# Patient Record
Sex: Female | Born: 2000 | Race: White | Hispanic: No | Marital: Single | Smoking: Never smoker
Health system: Southern US, Community
[De-identification: ages and names within clinical notes are randomized; demographics above are authoritative.]

## PROBLEM LIST (undated history)

## (undated) DIAGNOSIS — E049 Nontoxic goiter, unspecified: Secondary | ICD-10-CM

## (undated) DIAGNOSIS — R7303 Prediabetes: Secondary | ICD-10-CM

## (undated) DIAGNOSIS — R1013 Epigastric pain: Secondary | ICD-10-CM

## (undated) DIAGNOSIS — E301 Precocious puberty: Secondary | ICD-10-CM

## (undated) HISTORY — PX: TONSILLECTOMY: SUR1361

## (undated) HISTORY — PX: ADENOIDECTOMY: SUR15

## (undated) HISTORY — DX: Precocious puberty: E30.1

## (undated) HISTORY — DX: Prediabetes: R73.03

## (undated) HISTORY — DX: Nontoxic goiter, unspecified: E04.9

## (undated) HISTORY — DX: Epigastric pain: R10.13

---
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
## 2019-10-16 DIAGNOSIS — Z419 Encounter for procedure for purposes other than remedying health state, unspecified: Secondary | ICD-10-CM | POA: Diagnosis not present

## 2019-10-28 DIAGNOSIS — Z131 Encounter for screening for diabetes mellitus: Secondary | ICD-10-CM | POA: Diagnosis not present

## 2019-10-28 DIAGNOSIS — N771 Vaginitis, vulvitis and vulvovaginitis in diseases classified elsewhere: Secondary | ICD-10-CM | POA: Diagnosis not present

## 2019-10-28 DIAGNOSIS — Z1321 Encounter for screening for nutritional disorder: Secondary | ICD-10-CM | POA: Diagnosis not present

## 2019-11-04 DIAGNOSIS — N760 Acute vaginitis: Secondary | ICD-10-CM | POA: Diagnosis not present

## 2019-11-04 DIAGNOSIS — Z113 Encounter for screening for infections with a predominantly sexual mode of transmission: Secondary | ICD-10-CM | POA: Diagnosis not present

## 2019-11-04 DIAGNOSIS — Z3040 Encounter for surveillance of contraceptives, unspecified: Secondary | ICD-10-CM | POA: Diagnosis not present

## 2019-11-04 DIAGNOSIS — Z01419 Encounter for gynecological examination (general) (routine) without abnormal findings: Secondary | ICD-10-CM | POA: Diagnosis not present

## 2019-11-16 DIAGNOSIS — Z419 Encounter for procedure for purposes other than remedying health state, unspecified: Secondary | ICD-10-CM | POA: Diagnosis not present

## 2019-12-17 DIAGNOSIS — Z419 Encounter for procedure for purposes other than remedying health state, unspecified: Secondary | ICD-10-CM | POA: Diagnosis not present

## 2020-01-16 DIAGNOSIS — Z419 Encounter for procedure for purposes other than remedying health state, unspecified: Secondary | ICD-10-CM | POA: Diagnosis not present

## 2020-02-16 DIAGNOSIS — Z419 Encounter for procedure for purposes other than remedying health state, unspecified: Secondary | ICD-10-CM | POA: Diagnosis not present

## 2020-03-17 DIAGNOSIS — Z419 Encounter for procedure for purposes other than remedying health state, unspecified: Secondary | ICD-10-CM | POA: Diagnosis not present

## 2020-04-17 DIAGNOSIS — Z419 Encounter for procedure for purposes other than remedying health state, unspecified: Secondary | ICD-10-CM | POA: Diagnosis not present

## 2020-05-18 DIAGNOSIS — Z419 Encounter for procedure for purposes other than remedying health state, unspecified: Secondary | ICD-10-CM | POA: Diagnosis not present

## 2020-06-15 DIAGNOSIS — Z419 Encounter for procedure for purposes other than remedying health state, unspecified: Secondary | ICD-10-CM | POA: Diagnosis not present

## 2020-07-16 DIAGNOSIS — Z419 Encounter for procedure for purposes other than remedying health state, unspecified: Secondary | ICD-10-CM | POA: Diagnosis not present

## 2020-08-13 ENCOUNTER — Other Ambulatory Visit: Payer: Self-pay | Admitting: Family

## 2020-08-13 ENCOUNTER — Ambulatory Visit
Admission: RE | Admit: 2020-08-13 | Discharge: 2020-08-13 | Disposition: A | Discharge status: Home / Self Care | Payer: Medicaid Other | Attending: Family | Admitting: Family

## 2020-08-13 ENCOUNTER — Ambulatory Visit
Admission: RE | Admit: 2020-08-13 | Discharge: 2020-08-13 | Disposition: A | Discharge status: Home / Self Care | Payer: Medicaid Other | Source: Ambulatory Visit | Attending: Family | Admitting: Family

## 2020-08-13 DIAGNOSIS — R519 Headache, unspecified: Secondary | ICD-10-CM | POA: Insufficient documentation

## 2020-08-13 DIAGNOSIS — M26602 Left temporomandibular joint disorder, unspecified: Secondary | ICD-10-CM | POA: Insufficient documentation

## 2020-08-13 DIAGNOSIS — K121 Other forms of stomatitis: Secondary | ICD-10-CM | POA: Diagnosis not present

## 2020-08-13 DIAGNOSIS — R6884 Jaw pain: Secondary | ICD-10-CM | POA: Diagnosis not present

## 2020-08-15 DIAGNOSIS — Z419 Encounter for procedure for purposes other than remedying health state, unspecified: Secondary | ICD-10-CM | POA: Diagnosis not present

## 2020-09-10 DIAGNOSIS — R109 Unspecified abdominal pain: Secondary | ICD-10-CM | POA: Diagnosis not present

## 2020-09-10 DIAGNOSIS — J986 Disorders of diaphragm: Secondary | ICD-10-CM | POA: Diagnosis not present

## 2020-09-10 DIAGNOSIS — Z881 Allergy status to other antibiotic agents status: Secondary | ICD-10-CM | POA: Diagnosis not present

## 2020-09-10 DIAGNOSIS — K760 Fatty (change of) liver, not elsewhere classified: Secondary | ICD-10-CM | POA: Diagnosis not present

## 2020-09-10 DIAGNOSIS — N3001 Acute cystitis with hematuria: Secondary | ICD-10-CM | POA: Diagnosis not present

## 2020-09-10 DIAGNOSIS — N852 Hypertrophy of uterus: Secondary | ICD-10-CM | POA: Diagnosis not present

## 2020-09-10 DIAGNOSIS — R1032 Left lower quadrant pain: Secondary | ICD-10-CM | POA: Diagnosis not present

## 2020-09-10 DIAGNOSIS — M419 Scoliosis, unspecified: Secondary | ICD-10-CM | POA: Diagnosis not present

## 2020-09-15 DIAGNOSIS — Z419 Encounter for procedure for purposes other than remedying health state, unspecified: Secondary | ICD-10-CM | POA: Diagnosis not present

## 2020-10-15 DIAGNOSIS — Z419 Encounter for procedure for purposes other than remedying health state, unspecified: Secondary | ICD-10-CM | POA: Diagnosis not present

## 2020-10-20 DIAGNOSIS — Z113 Encounter for screening for infections with a predominantly sexual mode of transmission: Secondary | ICD-10-CM | POA: Diagnosis not present

## 2020-11-15 DIAGNOSIS — Z419 Encounter for procedure for purposes other than remedying health state, unspecified: Secondary | ICD-10-CM | POA: Diagnosis not present

## 2020-12-02 DIAGNOSIS — A609 Anogenital herpesviral infection, unspecified: Secondary | ICD-10-CM | POA: Diagnosis not present

## 2020-12-02 DIAGNOSIS — Z01419 Encounter for gynecological examination (general) (routine) without abnormal findings: Secondary | ICD-10-CM | POA: Diagnosis not present

## 2020-12-02 DIAGNOSIS — Z3040 Encounter for surveillance of contraceptives, unspecified: Secondary | ICD-10-CM | POA: Diagnosis not present

## 2020-12-16 DIAGNOSIS — Z419 Encounter for procedure for purposes other than remedying health state, unspecified: Secondary | ICD-10-CM | POA: Diagnosis not present

## 2021-01-15 DIAGNOSIS — Z419 Encounter for procedure for purposes other than remedying health state, unspecified: Secondary | ICD-10-CM | POA: Diagnosis not present

## 2021-02-15 DIAGNOSIS — Z419 Encounter for procedure for purposes other than remedying health state, unspecified: Secondary | ICD-10-CM | POA: Diagnosis not present

## 2021-03-17 DIAGNOSIS — Z419 Encounter for procedure for purposes other than remedying health state, unspecified: Secondary | ICD-10-CM | POA: Diagnosis not present

## 2021-04-17 DIAGNOSIS — Z419 Encounter for procedure for purposes other than remedying health state, unspecified: Secondary | ICD-10-CM | POA: Diagnosis not present

## 2021-05-18 DIAGNOSIS — Z419 Encounter for procedure for purposes other than remedying health state, unspecified: Secondary | ICD-10-CM | POA: Diagnosis not present

## 2021-06-15 DIAGNOSIS — Z419 Encounter for procedure for purposes other than remedying health state, unspecified: Secondary | ICD-10-CM | POA: Diagnosis not present

## 2021-07-16 DIAGNOSIS — Z419 Encounter for procedure for purposes other than remedying health state, unspecified: Secondary | ICD-10-CM | POA: Diagnosis not present

## 2021-08-15 DIAGNOSIS — Z419 Encounter for procedure for purposes other than remedying health state, unspecified: Secondary | ICD-10-CM | POA: Diagnosis not present

## 2021-09-15 DIAGNOSIS — Z419 Encounter for procedure for purposes other than remedying health state, unspecified: Secondary | ICD-10-CM | POA: Diagnosis not present

## 2021-10-15 DIAGNOSIS — Z419 Encounter for procedure for purposes other than remedying health state, unspecified: Secondary | ICD-10-CM | POA: Diagnosis not present

## 2021-11-12 IMAGING — CR DG TMJ OPEN & CLOSE UNILATERAL*L*
1 series · 3 of 3 positions shown · non-contrast
Comparison: None.

CLINICAL DATA: Pt states left sided jaw pain, headaches on left
side and behind her eye for years; pt states she clenches her teeth
when she sleeps

EXAM:
LEFT UNILATERAL TEMPOROMANDIBULAR JOINTS

[Series 1: dg tmj open & close unilateral left · 0.14mm/px · 3 of 3 slices shown]
[im 1/3]
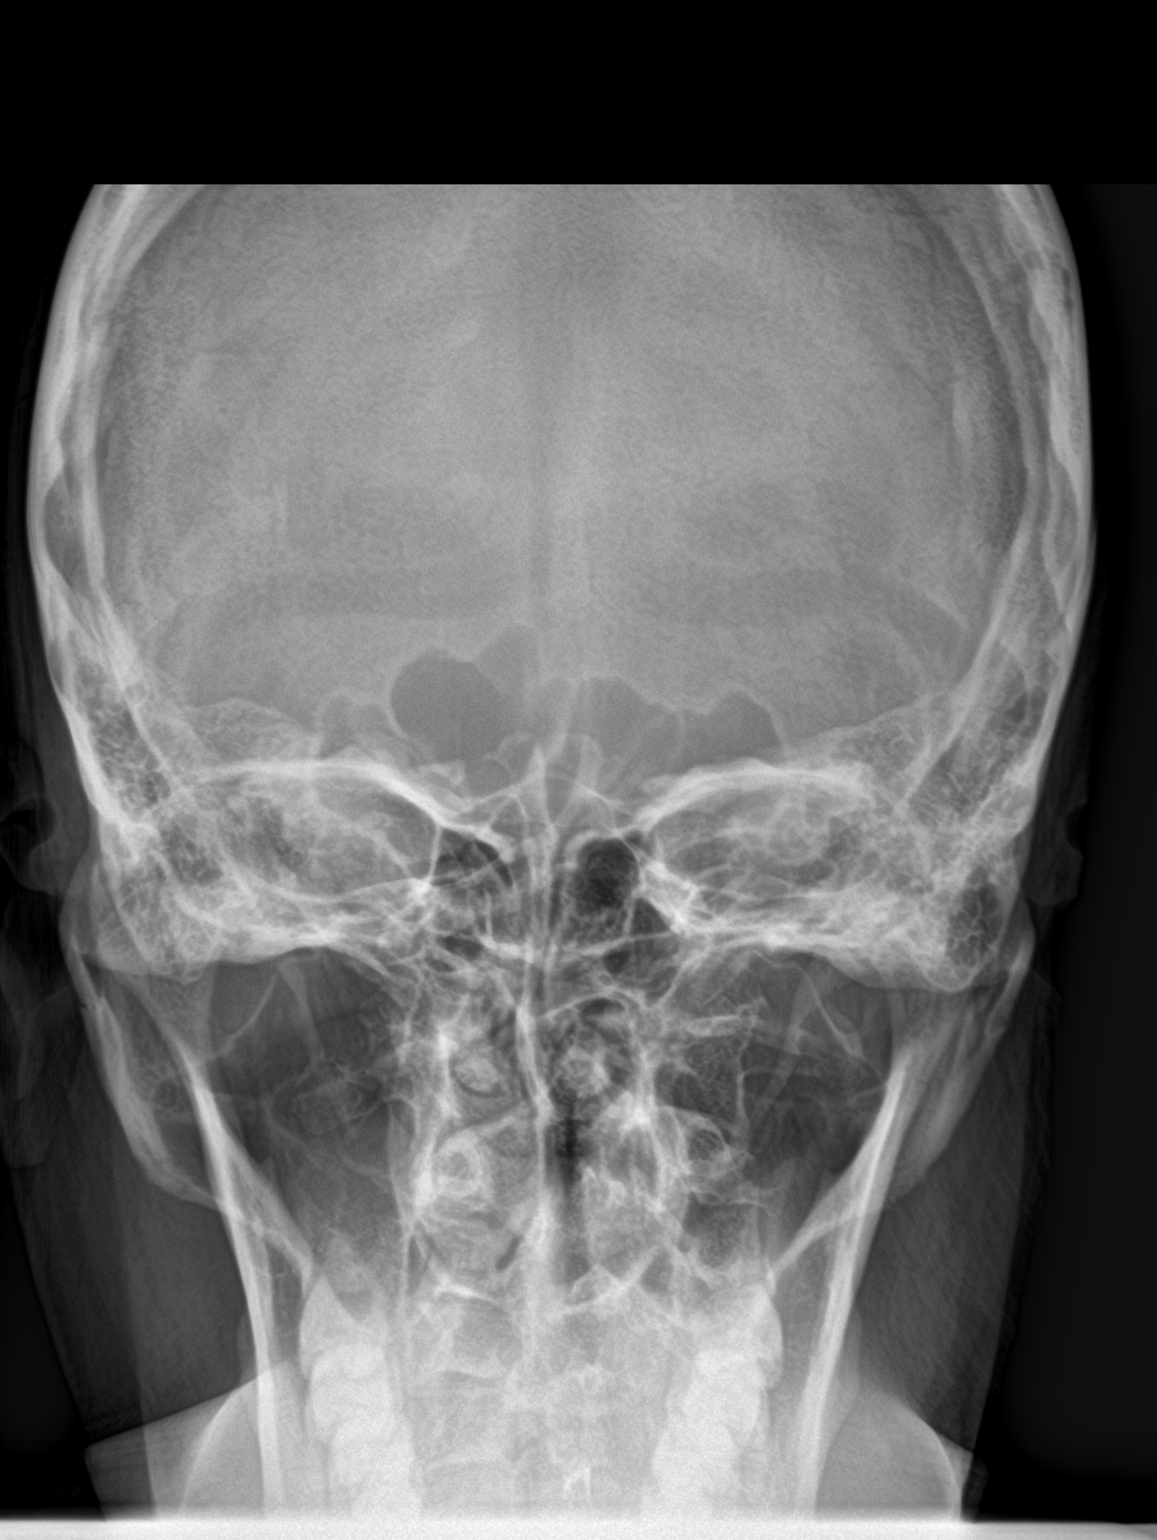
[im 2/3]
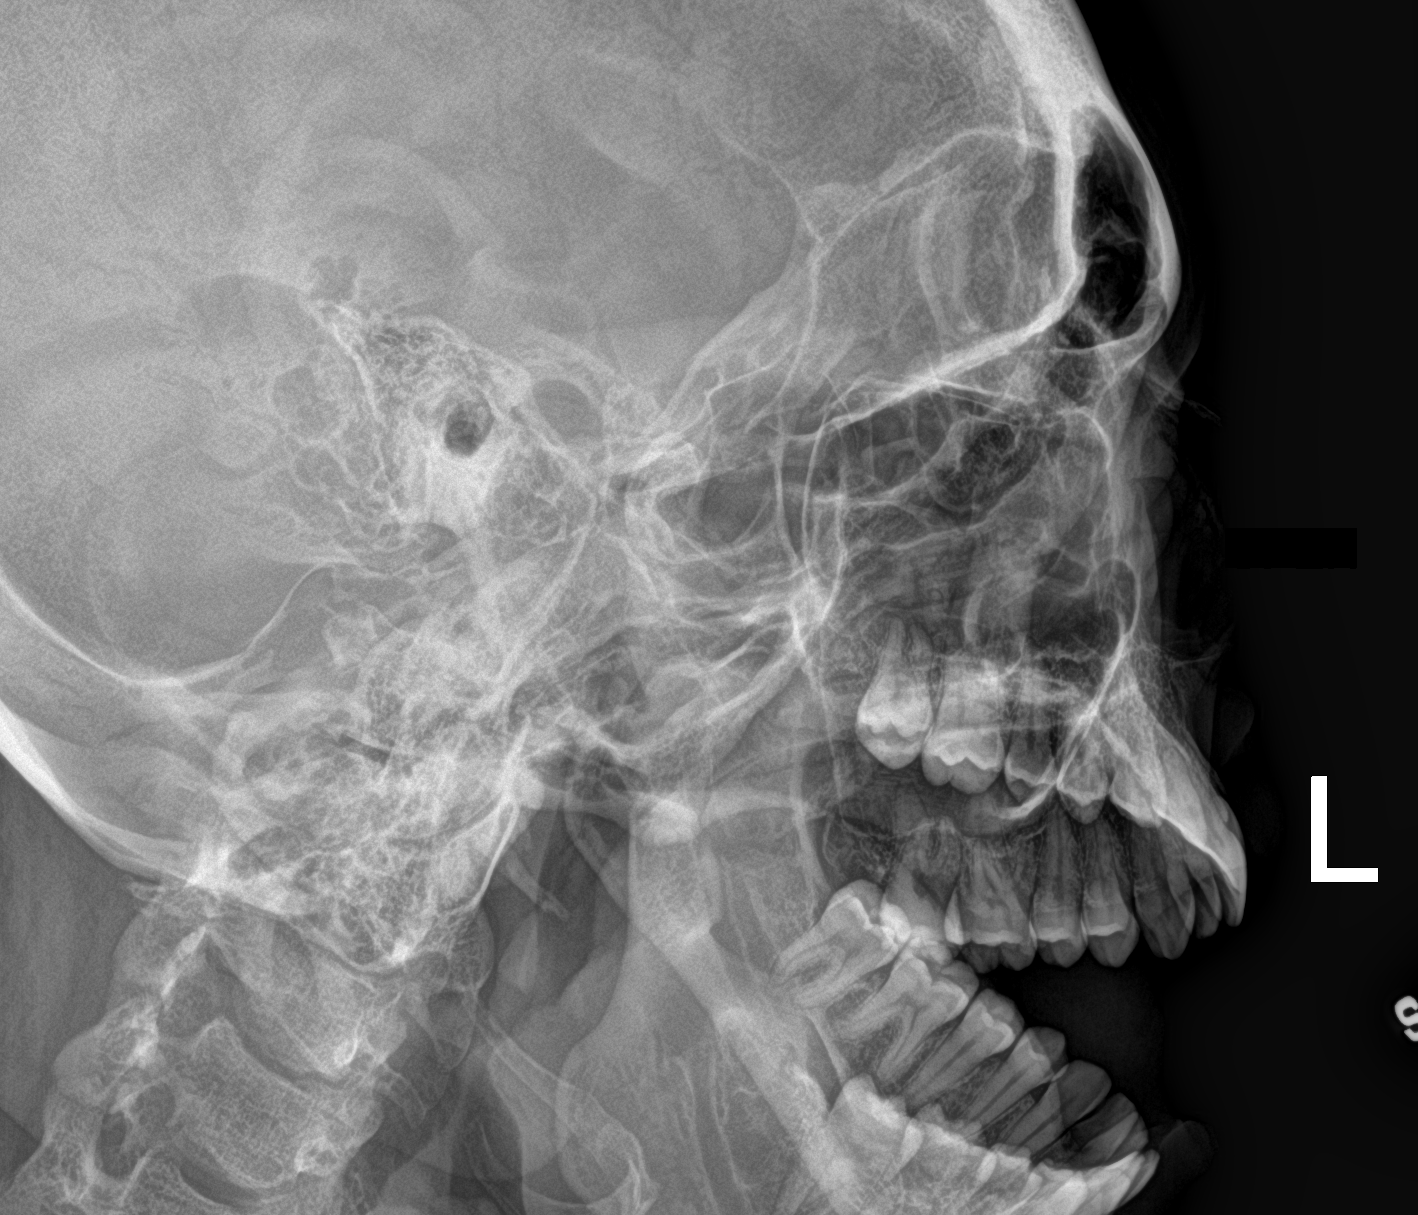
[im 3/3]
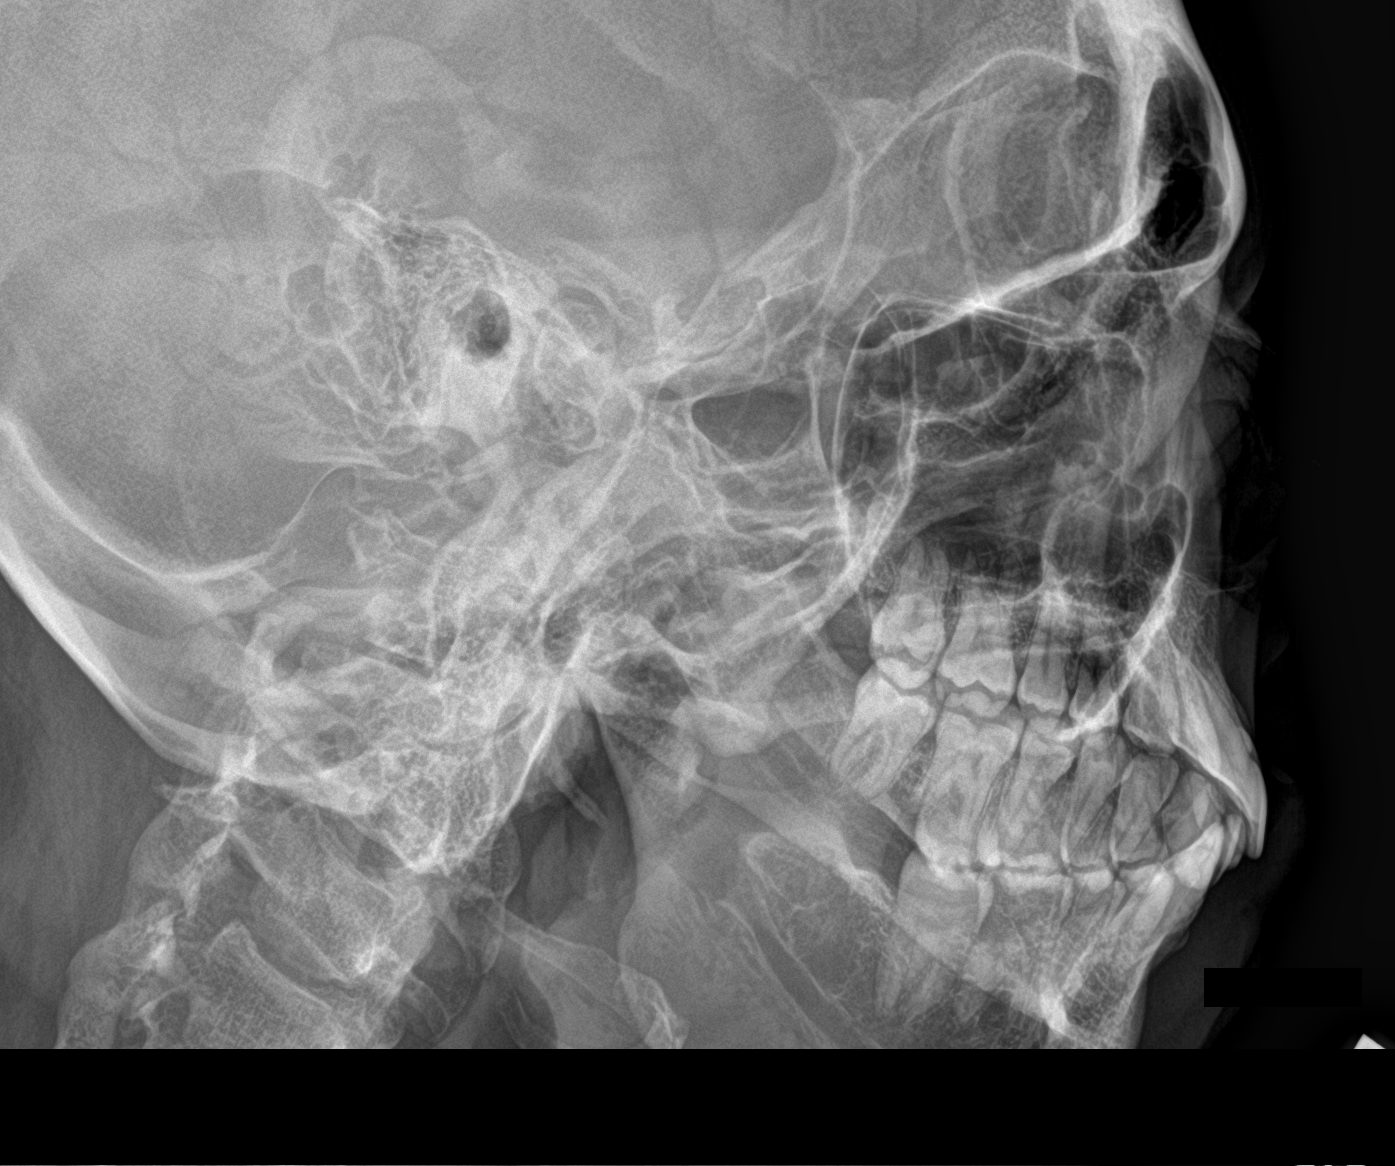

[3 of 3 positions shown; findings below may reference images not displayed]

FINDINGS: No acute fracture or dislocation. No aggressive osseous lesion.
Normal alignment.

Soft tissue are unremarkable. No radiopaque foreign body or soft
tissue emphysema. Paranasal sinuses are clear.
IMPRESSION: No osseous abnormality of the left temporomandibular joint. If there
is further clinical concern regarding the temporomandibular joints,
recommend a CT or MRI of the temporomandibular joints.

## 2021-11-15 DIAGNOSIS — Z419 Encounter for procedure for purposes other than remedying health state, unspecified: Secondary | ICD-10-CM | POA: Diagnosis not present

## 2021-12-16 DIAGNOSIS — Z419 Encounter for procedure for purposes other than remedying health state, unspecified: Secondary | ICD-10-CM | POA: Diagnosis not present

## 2021-12-20 DIAGNOSIS — Z124 Encounter for screening for malignant neoplasm of cervix: Secondary | ICD-10-CM | POA: Diagnosis not present

## 2022-01-15 DIAGNOSIS — Z419 Encounter for procedure for purposes other than remedying health state, unspecified: Secondary | ICD-10-CM | POA: Diagnosis not present

## 2022-02-15 DIAGNOSIS — Z419 Encounter for procedure for purposes other than remedying health state, unspecified: Secondary | ICD-10-CM | POA: Diagnosis not present

## 2022-03-17 DIAGNOSIS — Z419 Encounter for procedure for purposes other than remedying health state, unspecified: Secondary | ICD-10-CM | POA: Diagnosis not present

## 2022-04-17 DIAGNOSIS — Z419 Encounter for procedure for purposes other than remedying health state, unspecified: Secondary | ICD-10-CM | POA: Diagnosis not present

## 2022-05-18 DIAGNOSIS — Z419 Encounter for procedure for purposes other than remedying health state, unspecified: Secondary | ICD-10-CM | POA: Diagnosis not present

## 2022-06-16 DIAGNOSIS — Z419 Encounter for procedure for purposes other than remedying health state, unspecified: Secondary | ICD-10-CM | POA: Diagnosis not present

## 2022-07-17 DIAGNOSIS — Z419 Encounter for procedure for purposes other than remedying health state, unspecified: Secondary | ICD-10-CM | POA: Diagnosis not present

## 2022-08-08 ENCOUNTER — Telehealth: Payer: Self-pay

## 2022-08-08 NOTE — Telephone Encounter (Signed)
LVM for patient to call back to schedule PCP apt. AS, CMA 

## 2022-08-16 DIAGNOSIS — Z419 Encounter for procedure for purposes other than remedying health state, unspecified: Secondary | ICD-10-CM | POA: Diagnosis not present

## 2022-09-16 DIAGNOSIS — Z419 Encounter for procedure for purposes other than remedying health state, unspecified: Secondary | ICD-10-CM | POA: Diagnosis not present

## 2022-09-22 DIAGNOSIS — Z113 Encounter for screening for infections with a predominantly sexual mode of transmission: Secondary | ICD-10-CM | POA: Diagnosis not present

## 2022-10-16 DIAGNOSIS — Z419 Encounter for procedure for purposes other than remedying health state, unspecified: Secondary | ICD-10-CM | POA: Diagnosis not present

## 2022-11-16 DIAGNOSIS — Z419 Encounter for procedure for purposes other than remedying health state, unspecified: Secondary | ICD-10-CM | POA: Diagnosis not present

## 2022-12-17 DIAGNOSIS — Z419 Encounter for procedure for purposes other than remedying health state, unspecified: Secondary | ICD-10-CM | POA: Diagnosis not present

## 2022-12-25 DIAGNOSIS — Z0000 Encounter for general adult medical examination without abnormal findings: Secondary | ICD-10-CM | POA: Diagnosis not present

## 2022-12-25 DIAGNOSIS — Z6835 Body mass index (BMI) 35.0-35.9, adult: Secondary | ICD-10-CM | POA: Diagnosis not present

## 2023-01-16 DIAGNOSIS — Z419 Encounter for procedure for purposes other than remedying health state, unspecified: Secondary | ICD-10-CM | POA: Diagnosis not present

## 2023-02-16 DIAGNOSIS — Z419 Encounter for procedure for purposes other than remedying health state, unspecified: Secondary | ICD-10-CM | POA: Diagnosis not present

## 2023-03-18 DIAGNOSIS — Z419 Encounter for procedure for purposes other than remedying health state, unspecified: Secondary | ICD-10-CM | POA: Diagnosis not present

## 2023-04-18 DIAGNOSIS — Z419 Encounter for procedure for purposes other than remedying health state, unspecified: Secondary | ICD-10-CM | POA: Diagnosis not present

## 2023-05-19 DIAGNOSIS — Z419 Encounter for procedure for purposes other than remedying health state, unspecified: Secondary | ICD-10-CM | POA: Diagnosis not present

## 2023-06-16 DIAGNOSIS — Z419 Encounter for procedure for purposes other than remedying health state, unspecified: Secondary | ICD-10-CM | POA: Diagnosis not present

## 2023-07-05 DIAGNOSIS — J029 Acute pharyngitis, unspecified: Secondary | ICD-10-CM | POA: Diagnosis not present

## 2023-07-05 DIAGNOSIS — B9689 Other specified bacterial agents as the cause of diseases classified elsewhere: Secondary | ICD-10-CM | POA: Diagnosis not present

## 2023-07-05 DIAGNOSIS — R051 Acute cough: Secondary | ICD-10-CM | POA: Diagnosis not present

## 2023-07-05 DIAGNOSIS — J019 Acute sinusitis, unspecified: Secondary | ICD-10-CM | POA: Diagnosis not present

## 2023-07-17 DIAGNOSIS — A609 Anogenital herpesviral infection, unspecified: Secondary | ICD-10-CM | POA: Diagnosis not present

## 2023-07-17 DIAGNOSIS — N760 Acute vaginitis: Secondary | ICD-10-CM | POA: Diagnosis not present

## 2023-07-28 DIAGNOSIS — Z419 Encounter for procedure for purposes other than remedying health state, unspecified: Secondary | ICD-10-CM | POA: Diagnosis not present

## 2023-08-27 DIAGNOSIS — Z419 Encounter for procedure for purposes other than remedying health state, unspecified: Secondary | ICD-10-CM | POA: Diagnosis not present

## 2023-09-27 DIAGNOSIS — Z419 Encounter for procedure for purposes other than remedying health state, unspecified: Secondary | ICD-10-CM | POA: Diagnosis not present

## 2023-10-27 DIAGNOSIS — Z419 Encounter for procedure for purposes other than remedying health state, unspecified: Secondary | ICD-10-CM | POA: Diagnosis not present

## 2024-02-16 ENCOUNTER — Ambulatory Visit: Payer: Self-pay

## 2024-02-16 ENCOUNTER — Ambulatory Visit
Admission: EM | Admit: 2024-02-16 | Discharge: 2024-02-16 | Disposition: A | Discharge status: Home / Self Care | Payer: Self-pay

## 2024-02-16 DIAGNOSIS — S93401A Sprain of unspecified ligament of right ankle, initial encounter: Secondary | ICD-10-CM

## 2024-02-16 DIAGNOSIS — S8261XA Displaced fracture of lateral malleolus of right fibula, initial encounter for closed fracture: Secondary | ICD-10-CM

## 2024-02-16 MED ORDER — HYDROCODONE-ACETAMINOPHEN 5-325 MG PO TABS: 1.0000 | ORAL_TABLET | Freq: Four times a day (QID) | ORAL | 0 refills | Status: AC | PRN

## 2024-02-16 NOTE — ED Triage Notes (Signed)
 Casey Fleming presents with right ankle pain, fell last night and it is painful and swollen. Treated with Ibuprofen.

## 2024-02-16 NOTE — ED Notes (Signed)
 Pt declined cam walker boot and crutches. States she has a boot at home and is getting a knee scooter sent to her tomorrow. She has a family member's seated walker she can use today. Pt verbalized understanding to wear boot at all times, NO weight bearing, and to f/u with ortho this week. D/c home with family to drive.

## 2024-02-16 NOTE — ED Provider Notes (Signed)
 [facility]    CSN: [id number] Arrival date & time: 02/16/24  1333      History   Chief Complaint Chief Complaint  Patient presents with   Ankle Pain    HPI Casey Fleming is a 23 y.o. female.   Pt states that she was in heels last night, missed the curb, and inverted her right ankle causing her to fall. Pt states that she is experiencing 4/10 pain at rest and 8/10 when attempted to bear weight. Pt denies being able bear weight on right lower extremity. Pt states that she took 600 mg of ibuprofen with some relief of pain.   The history is provided by the patient.  Ankle Pain   Past Medical History:  Diagnosis Date   Dyspepsia    Goiter    Pre-diabetes    Precocious puberty    Thyroiditis     Patient Active Problem List   Diagnosis Date Noted   Goiter 12/04/2011   Pre-diabetes 09/29/2010   Thyroiditis 09/29/2010   Obesity 09/29/2010    Past Surgical History:  Procedure Laterality Date   ADENOIDECTOMY     TONSILLECTOMY      OB History   No obstetric history on file.      Home Medications    Prior to Admission medications   Medication Sig Start Date End Date Taking? Authorizing Provider  famciclovir (FAMVIR) 500 MG tablet Take 500 mg by mouth every 8 (eight) hours. 12/28/23  Yes [provider]  metFORMIN  (GLUCOPHAGE ) 500 MG tablet Take 1 tablet (500 mg total) by mouth 2 (two) times daily with a meal. 05/13/12   Dorrene Nest, MD  ranitidine  (ZANTAC ) 150 MG tablet Take 1 tablet (150 mg total) by mouth 2 (two) times daily. 02/01/11   Dorrene Nest, MD    Family History Family History  Problem Relation Age of Onset   Thyroid  disease Mother    Obesity Mother    Thyroid  disease Sister    Obesity Sister    Thyroid  disease Maternal Grandmother    Thyroid  disease Paternal Grandfather     Social History Social History   Tobacco Use   Smoking status: Never   Smokeless tobacco: Never  Substance Use Topics   Alcohol use: Yes     Comment: socially   Drug use: Not Currently     Allergies   Zithromax [azithromycin]   Review of Systems Review of Systems   Physical Exam Triage Vital Signs ED Triage Vitals  Encounter Vitals Group     BP 02/16/24 1347 109/75     Girls Systolic BP Percentile --      Girls Diastolic BP Percentile --      Boys Systolic BP Percentile --      Boys Diastolic BP Percentile --      Pulse Rate 02/16/24 1347 97     Resp --      Temp 02/16/24 1347 98.8 F (37.1 C)     Temp src --      SpO2 02/16/24 1347 97 %     Weight 02/16/24 1343 230 lb (104.3 kg)     Height 02/16/24 1343 5' 5 (1.651 m)     Head Circumference --      Peak Flow --      Pain Score 02/16/24 1343 4     Pain Loc --      Pain Education --      Exclude from Growth Chart --    No data  found.  Updated Vital Signs BP 109/75 (BP Location: Right Arm)   Pulse 97   Temp 98.8 F (37.1 C)   Ht 5' 5 (1.651 m)   Wt 230 lb (104.3 kg)   LMP 01/31/2024 (Approximate)   SpO2 97%   BMI 38.27 kg/m   Visual Acuity Right Eye Distance:   Left Eye Distance:   Bilateral Distance:    Right Eye Near:   Left Eye Near:    Bilateral Near:     Physical Exam Vitals and nursing note reviewed.  Constitutional:      General: She is not in acute distress.    Appearance: Normal appearance. She is not ill-appearing, toxic-appearing or diaphoretic.  Eyes:     General: No scleral icterus. Cardiovascular:     Rate and Rhythm: Normal rate and regular rhythm.     Heart sounds: Normal heart sounds.  Pulmonary:     Effort: Pulmonary effort is normal. No respiratory distress.     Breath sounds: Normal breath sounds. No wheezing or rhonchi.  Musculoskeletal:     Right ankle: Swelling present. No deformity, ecchymosis or lacerations. Tenderness present over the lateral malleolus and ATF ligament. Decreased range of motion. Normal pulse.       Feet:     Comments: Assessed in wheelchair provided by clinic, tenderness where  indicated on diagram   Skin:    General: Skin is warm.  Neurological:     Mental Status: She is alert and oriented to person, place, and time.  Psychiatric:        Mood and Affect: Mood normal.        Behavior: Behavior normal.      UC Treatments / Results  Labs (all labs ordered are listed, but only abnormal results are displayed) Labs Reviewed - No data to display  EKG   Radiology No results found.  Procedures Procedures (including critical care time)  Medications Ordered in UC Medications - No data to display  Initial Impression / Assessment and Plan / UC Course  I have reviewed the triage vital signs and the nursing notes.  Pertinent labs & imaging results that were available during my care of the patient were reviewed by me and considered in my medical decision making (see chart for details).     Patient was prescribed Norco as needed for pain from fracture of lateral malleolus of right ankle.  Patient was advised to follow-up with orthopedist and given number for 1, walker boot and crutches were offered in office but patient declined. Final Clinical Impressions(s) / UC Diagnoses   Final diagnoses:  Sprain of right ankle, unspecified ligament, initial encounter   Discharge Instructions   None    ED Prescriptions   None    PDMP not reviewed this encounter.   Andra Corean BROCKS, PA-C 02/16/24 [phone]

## 2024-02-25 ENCOUNTER — Ambulatory Visit: Admitting: Orthopedic Surgery
